# Patient Record
Sex: Male | Born: 1995 | Race: Black or African American | Hispanic: No | Marital: Single | Smoking: Never smoker
Health system: Southern US, Community
[De-identification: ages and names within clinical notes are randomized; demographics above are authoritative.]

---

## 2014-09-01 ENCOUNTER — Encounter (HOSPITAL_COMMUNITY): Payer: Self-pay | Admitting: Emergency Medicine

## 2014-09-01 ENCOUNTER — Emergency Department (HOSPITAL_COMMUNITY)
Admission: EM | Admit: 2014-09-01 | Discharge: 2014-09-01 | Disposition: A | Discharge status: Home / Self Care | Payer: BLUE CROSS/BLUE SHIELD | Attending: Emergency Medicine | Admitting: Emergency Medicine

## 2014-09-01 DIAGNOSIS — Y9389 Activity, other specified: Secondary | ICD-10-CM | POA: Diagnosis not present

## 2014-09-01 DIAGNOSIS — Y998 Other external cause status: Secondary | ICD-10-CM | POA: Diagnosis not present

## 2014-09-01 DIAGNOSIS — Z041 Encounter for examination and observation following transport accident: Secondary | ICD-10-CM | POA: Insufficient documentation

## 2014-09-01 DIAGNOSIS — Y92410 Unspecified street and highway as the place of occurrence of the external cause: Secondary | ICD-10-CM | POA: Insufficient documentation

## 2014-09-01 NOTE — ED Notes (Signed)
No answer
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
## 2014-09-01 NOTE — ED Notes (Signed)
Gave pt water, per Delorise Jackson - PA
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
## 2014-09-05 ENCOUNTER — Emergency Department (HOSPITAL_COMMUNITY)
Admission: EM | Admit: 2014-09-05 | Discharge: 2014-09-05 | Disposition: A | Discharge status: Home / Self Care | Payer: BLUE CROSS/BLUE SHIELD | Attending: Emergency Medicine | Admitting: Emergency Medicine

## 2014-09-05 ENCOUNTER — Encounter (HOSPITAL_COMMUNITY): Payer: Self-pay | Admitting: Nurse Practitioner

## 2014-09-05 DIAGNOSIS — M25511 Pain in right shoulder: Secondary | ICD-10-CM | POA: Insufficient documentation

## 2014-09-05 DIAGNOSIS — M25512 Pain in left shoulder: Secondary | ICD-10-CM | POA: Insufficient documentation

## 2014-09-05 DIAGNOSIS — M545 Low back pain, unspecified: Secondary | ICD-10-CM

## 2014-09-05 DIAGNOSIS — Z87828 Personal history of other (healed) physical injury and trauma: Secondary | ICD-10-CM | POA: Insufficient documentation

## 2014-09-05 DIAGNOSIS — M546 Pain in thoracic spine: Secondary | ICD-10-CM | POA: Diagnosis not present

## 2014-09-05 DIAGNOSIS — R51 Headache: Secondary | ICD-10-CM | POA: Insufficient documentation

## 2014-09-05 MED ORDER — CYCLOBENZAPRINE HCL 10 MG PO TABS: 10.0000 mg | ORAL_TABLET | Freq: Two times a day (BID) | ORAL | Status: DC | PRN

## 2014-09-05 MED ORDER — IBUPROFEN 800 MG PO TABS: 800.0000 mg | ORAL_TABLET | Freq: Three times a day (TID) | ORAL | Status: DC
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
## 2014-09-05 NOTE — Discharge Instructions (Signed)
Back Exercises Back exercises help treat and prevent back injuries. The goal of back exercises is to increase the strength of your abdominal and back muscles and the flexibility of your back. These exercises should be started when you no longer have back pain. Back exercises include:  Pelvic Tilt. Lie on your back with your knees bent. Tilt your pelvis until the lower part of your back is against the floor. Hold this position 5 to 10 sec and repeat 5 to 10 times.  Knee to Chest. Pull first 1 knee up against your chest and hold for 20 to 30 seconds, repeat this with the other knee, and then both knees. This may be done with the other leg straight or bent, whichever feels better.  Sit-Ups or Curl-Ups. Bend your knees 90 degrees. Start with tilting your pelvis, and do a partial, slow sit-up, lifting your trunk only 30 to 45 degrees off the floor. Take at least 2 to 3 seconds for each sit-up. Do not do sit-ups with your knees out straight. If partial sit-ups are difficult, simply do the above but with only tightening your abdominal muscles and holding it as directed.  Hip-Lift. Lie on your back with your knees flexed 90 degrees. Push down with your feet and shoulders as you raise your hips a couple inches off the floor; hold for 10 seconds, repeat 5 to 10 times.  Back arches. Lie on your stomach, propping yourself up on bent elbows. Slowly press on your hands, causing an arch in your low back. Repeat 3 to 5 times. Any initial stiffness and discomfort should lessen with repetition over time.  Shoulder-Lifts. Lie face down with arms beside your body. Keep hips and torso pressed to floor as you slowly lift your head and shoulders off the floor. Do not overdo your exercises, especially in the beginning. Exercises may cause you some mild back discomfort which lasts for a few minutes; however, if the pain is more severe, or lasts for more than 15 minutes, do not continue exercises until you see your caregiver.  Improvement with exercise therapy for back problems is slow.  See your caregivers for assistance with developing a proper back exercise program. Document Released: 04/10/2004 Document Revised: 05/26/2011 Document Reviewed: 01/02/2011 [facility] Patient Information 2015 [place], [place]. This information is not intended to replace advice given to you by your health care provider. Make sure you discuss any questions you have with your health care provider.  Back Injury Prevention Back injuries can be extremely painful and difficult to heal. After having one back injury, you are much more likely to experience another later on. It is important to learn how to avoid injuring or re-injuring your back. The following tips can help you to prevent a back injury. PHYSICAL FITNESS  Exercise regularly and try to develop good tone in your abdominal muscles. Your abdominal muscles provide a lot of the support needed by your back.  Do aerobic exercises (walking, jogging, biking, swimming) regularly.  Do exercises that increase balance and strength (tai chi, yoga) regularly. This can decrease your risk of falling and injuring your back.  Stretch before and after exercising.  Maintain a healthy weight. The more you weigh, the more stress is placed on your back. For every pound of weight, 10 times that amount of pressure is placed on the back. DIET  Talk to your caregiver about how much calcium and vitamin D you need per day. These nutrients help to prevent weakening of the bones (osteoporosis). Osteoporosis  can cause broken (fractured) bones that lead to back pain. °· Include good sources of calcium in your diet, such as dairy products, green, leafy vegetables, and products with calcium added (fortified). °· Include good sources of vitamin D in your diet, such as milk and foods that are fortified with vitamin D. °· Consider taking a nutritional supplement or a multivitamin if needed. °· Stop smoking if you  smoke. °POSTURE °· Sit and stand up straight. Avoid leaning forward when you sit or hunching over when you stand. °· Choose chairs with good low back (lumbar) support. °· If you work at a desk, sit close to your work so you do not need to lean over. Keep your chin tucked in. Keep your neck drawn back and elbows bent at a right angle. Your arms should look like the letter "L." °· Sit high and close to the steering wheel when you drive. Add a lumbar support to your car seat if needed. °· Avoid sitting or standing in one position for too long. Take breaks to get up, stretch, and walk around at least once every hour. Take breaks if you are driving for long periods of time. °· Sleep on your side with your knees slightly bent, or sleep on your back with a pillow under your knees. Do not sleep on your stomach. °LIFTING, TWISTING, AND REACHING °· Avoid heavy lifting, especially repetitive lifting. If you must do heavy lifting: °¨ Stretch before lifting. °¨ Work slowly. °¨ Rest between lifts. °¨ Use carts and dollies to move objects when possible. °¨ Make several small trips instead of carrying 1 heavy load. °¨ Ask for help when you need it. °¨ Ask for help when moving big, awkward objects. °· Follow these steps when lifting: °¨ Stand with your feet shoulder-width apart. °¨ Get as close to the object as you can. Do not try to pick up heavy objects that are far from your body. °¨ Use handles or lifting straps if they are available. °¨ Bend at your knees. Squat down, but keep your heels off the floor. °¨ Keep your shoulders pulled back, your chin tucked in, and your back straight. °¨ Lift the object slowly, tightening the muscles in your legs, abdomen, and buttocks. Keep the object as close to the center of your body as possible. °¨ When you put a load down, use these same guidelines in reverse. °· Do not: °¨ Lift the object above your waist. °¨ Twist at the waist while lifting or carrying a load. Move your feet if you need to  turn, not your waist. °¨ Bend over without bending at your knees. °· Avoid reaching over your head, across a table, or for an object on a high surface. °OTHER TIPS °· Avoid wet floors and keep sidewalks clear of ice to prevent falls. °· Do not sleep on a mattress that is too soft or too hard. °· Keep items that are used frequently within easy reach. °· Put heavier objects on shelves at waist level and lighter objects on lower or higher shelves. °· Find ways to decrease your stress, such as exercise, massage, or relaxation techniques. Stress can build up in your muscles. Tense muscles are more vulnerable to injury. °· Seek treatment for depression or anxiety if needed. These conditions can increase your risk of developing back pain. °SEEK MEDICAL CARE IF: °· You injure your back. °· You have questions about diet, exercise, or other ways to prevent back injuries. °MAKE SURE YOU: °· Understand these   instructions. °· Will watch your condition. °· Will get help right away if you are not doing well or get worse. °Document Released: 04/10/2004 Document Revised: 05/26/2011 Document Reviewed: 04/14/2011 °[employer]® Patient Information ©2015 [employer]. This information is not intended to replace advice given to you by your health care provider. Make sure you discuss any questions you have with your health care provider. ° °
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
## 2015-02-15 ENCOUNTER — Encounter (HOSPITAL_COMMUNITY): Payer: Self-pay | Admitting: Neurology

## 2015-02-15 ENCOUNTER — Emergency Department (HOSPITAL_COMMUNITY)
Admission: EM | Admit: 2015-02-15 | Discharge: 2015-02-15 | Disposition: A | Discharge status: Home / Self Care | Payer: BLUE CROSS/BLUE SHIELD | Attending: Emergency Medicine | Admitting: Emergency Medicine

## 2015-02-15 ENCOUNTER — Emergency Department (HOSPITAL_COMMUNITY): Payer: BLUE CROSS/BLUE SHIELD

## 2015-02-15 DIAGNOSIS — Y998 Other external cause status: Secondary | ICD-10-CM | POA: Diagnosis not present

## 2015-02-15 DIAGNOSIS — S199XXA Unspecified injury of neck, initial encounter: Secondary | ICD-10-CM | POA: Insufficient documentation

## 2015-02-15 DIAGNOSIS — M7918 Myalgia, other site: Secondary | ICD-10-CM

## 2015-02-15 DIAGNOSIS — Y92410 Unspecified street and highway as the place of occurrence of the external cause: Secondary | ICD-10-CM | POA: Diagnosis not present

## 2015-02-15 DIAGNOSIS — Y9389 Activity, other specified: Secondary | ICD-10-CM | POA: Diagnosis not present

## 2015-02-15 DIAGNOSIS — S3992XA Unspecified injury of lower back, initial encounter: Secondary | ICD-10-CM | POA: Insufficient documentation

## 2015-02-15 MED ORDER — CYCLOBENZAPRINE HCL 10 MG PO TABS: 10.0000 mg | ORAL_TABLET | Freq: Three times a day (TID) | ORAL | Status: DC | PRN

## 2015-02-15 MED ORDER — IBUPROFEN 800 MG PO TABS: 800.0000 mg | ORAL_TABLET | Freq: Three times a day (TID) | ORAL | Status: DC | PRN
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
## 2015-02-15 NOTE — ED Notes (Signed)
Pt verbalized understanding of d/c instructions and has no further questions. Pt stable and NAD.  
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
## 2015-02-17 ENCOUNTER — Encounter (HOSPITAL_COMMUNITY): Payer: Self-pay | Admitting: Nurse Practitioner

## 2015-02-17 ENCOUNTER — Emergency Department (HOSPITAL_COMMUNITY)
Admission: EM | Admit: 2015-02-17 | Discharge: 2015-02-17 | Disposition: A | Discharge status: Home / Self Care | Payer: BLUE CROSS/BLUE SHIELD | Attending: Emergency Medicine | Admitting: Emergency Medicine

## 2015-02-17 DIAGNOSIS — M5412 Radiculopathy, cervical region: Secondary | ICD-10-CM

## 2015-02-17 DIAGNOSIS — M546 Pain in thoracic spine: Secondary | ICD-10-CM | POA: Insufficient documentation

## 2015-02-17 DIAGNOSIS — R51 Headache: Secondary | ICD-10-CM | POA: Insufficient documentation

## 2015-02-17 DIAGNOSIS — M2540 Effusion, unspecified joint: Secondary | ICD-10-CM | POA: Diagnosis not present

## 2015-02-17 DIAGNOSIS — G8929 Other chronic pain: Secondary | ICD-10-CM | POA: Diagnosis not present

## 2015-02-17 DIAGNOSIS — G8911 Acute pain due to trauma: Secondary | ICD-10-CM | POA: Insufficient documentation

## 2015-02-17 DIAGNOSIS — M545 Low back pain: Secondary | ICD-10-CM | POA: Diagnosis not present

## 2015-02-17 DIAGNOSIS — M549 Dorsalgia, unspecified: Secondary | ICD-10-CM

## 2015-02-17 DIAGNOSIS — M542 Cervicalgia: Secondary | ICD-10-CM

## 2015-02-17 MED ORDER — DEXAMETHASONE SODIUM PHOSPHATE 10 MG/ML IJ SOLN
6.0000 mg | Freq: Once | INTRAMUSCULAR | Status: AC
  Administered 2015-02-17: 6 mg via INTRAMUSCULAR
  Filled 2015-02-17: qty 1

## 2015-02-17 MED ORDER — IBUPROFEN 800 MG PO TABS: 800.0000 mg | ORAL_TABLET | Freq: Three times a day (TID) | ORAL | Status: AC | PRN

## 2015-02-17 MED ORDER — CYCLOBENZAPRINE HCL 5 MG PO TABS: 5.0000 mg | ORAL_TABLET | Freq: Three times a day (TID) | ORAL | Status: AC | PRN

## 2015-02-17 MED ORDER — KETOROLAC TROMETHAMINE 60 MG/2ML IM SOLN
60.0000 mg | Freq: Once | INTRAMUSCULAR | Status: AC
  Administered 2015-02-17: 60 mg via INTRAMUSCULAR
  Filled 2015-02-17: qty 2
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
## 2015-02-17 NOTE — Discharge Instructions (Signed)
Cervical Radiculopathy Cervical radiculopathy happens when a nerve in the neck (cervical nerve) is pinched or bruised. This condition can develop because of an injury or as part of the normal aging process. Pressure on the cervical nerves can cause pain or numbness that runs from the neck all the way down into the arm and fingers. Usually, this condition gets better with rest. Treatment may be needed if the condition does not improve.  CAUSES This condition may be caused by:  Injury.  Slipped (herniated) disk.  Muscle tightness in the neck because of overuse.  Arthritis.  Breakdown or degeneration in the bones and joints of the spine (spondylosis) due to aging.  Bone spurs that may develop near the cervical nerves. SYMPTOMS Symptoms of this condition include:  Pain that runs from the neck to the arm and hand. The pain can be severe or irritating. It may be worse when the neck is moved.  Numbness or weakness in the affected arm and hand. Back Pain, Adult Back pain is very common in adults.The cause of back pain is rarely dangerous and the pain often gets better over time.The cause of your back pain may not be known. Some common causes of back pain include: Strain of the muscles or ligaments supporting the spine. Wear and tear (degeneration) of the spinal disks. Arthritis. Direct injury to the back. For many people, back pain may return. Since back pain is rarely dangerous, most people can learn to manage this condition on their own. HOME CARE INSTRUCTIONS Watch your back pain for any changes. The following actions may help to lessen any discomfort you are feeling: Remain active. It is stressful on your back to sit or stand in one place for long periods of time. Do not sit, drive, or stand in one place for more than 30 minutes at a time. Take short walks on even surfaces as soon as you are able.Try to increase the length of time you walk each day. Exercise regularly as directed by  your health care provider. Exercise helps your back heal faster. It also helps avoid future injury by keeping your muscles strong and flexible. Do not stay in bed.Resting more than 1-2 days can delay your recovery. Pay attention to your body when you bend and lift. The most comfortable positions are those that put less stress on your recovering back. Always use proper lifting techniques, including: Bending your knees. Keeping the load close to your body. Avoiding twisting. Find a comfortable position to sleep. Use a firm mattress and lie on your side with your knees slightly bent. If you lie on your back, put a pillow under your knees. Avoid feeling anxious or stressed.Stress increases muscle tension and can worsen back pain.It is important to recognize when you are anxious or stressed and learn ways to manage it, such as with exercise. Take medicines only as directed by your health care provider. Over-the-counter medicines to reduce pain and inflammation are often the most helpful.Your health care provider may prescribe muscle relaxant drugs.These medicines help dull your pain so you can more quickly return to your normal activities and healthy exercise. Apply ice to the injured area: Put ice in a plastic bag. Place a towel between your skin and the bag. Leave the ice on for 20 minutes, 2-3 times a day for the first 2-3 days. After that, ice and heat may be alternated to reduce pain and spasms. Maintain a healthy weight. Excess weight puts extra stress on your back and makes it  difficult to maintain good posture. SEEK MEDICAL CARE IF: You have pain that is not relieved with rest or medicine. You have increasing pain going down into the legs or buttocks. You have pain that does not improve in one week. You have night pain. You lose weight. You have a fever or chills. SEEK IMMEDIATE MEDICAL CARE IF:  You develop new bowel or bladder control problems. You have unusual weakness or numbness  in your arms or legs. You develop nausea or vomiting. You develop abdominal pain. You feel faint.   This information is not intended to replace advice given to you by your health care provider. Make sure you discuss any questions you have with your health care provider.   Document Released: 03/03/2005 Document Revised: 03/24/2014 Document Reviewed: 07/05/2013 Elsevier Interactive Patient Education 2016 [employer].  DIAGNOSIS This condition may be diagnosed based on symptoms, medical history, and a physical exam. You may also have tests, including:  X-rays.  CT scan.  MRI.  Electromyogram (EMG).  Nerve conduction tests. TREATMENT In many cases, treatment is not needed for this condition. With rest, the condition usually gets better over time. If treatment is needed, options may include:  Wearing a soft neck collar for short periods of time.  Physical therapy to strengthen your neck muscles.  Medicines, such as NSAIDs, oral corticosteroids, or spinal injections.  Surgery. This may be needed if other treatments do not help. Various types of surgery may be done depending on the cause of your problems. HOME CARE INSTRUCTIONS Managing Pain  Take over-the-counter and prescription medicines only as told by your health care provider.  If directed, apply ice to the affected area.  Put ice in a plastic bag.  Place a towel between your skin and the bag.  Leave the ice on for 20 minutes, 2-3 times per day.  If ice does not help, you can try using heat. Take a warm shower or warm bath, or use a heat pack as told by your health care provider.  Try a gentle neck and shoulder massage to help relieve symptoms. Activity  Rest as needed. Follow instructions from your health care provider about any restrictions on activities.  Do stretching and strengthening exercises as told by your health care provider or physical therapist. General Instructions  If you were given a soft  collar, wear it as told by your health care provider.  Use a flat pillow when you sleep.  Keep all follow-up visits as told by your health care provider. This is important. SEEK MEDICAL CARE IF:  Your condition does not improve with treatment. SEEK IMMEDIATE MEDICAL CARE IF:  Your pain gets much worse and cannot be controlled with medicines.  You have weakness or numbness in your hand, arm, face, or leg.  You have a high fever.  You have a stiff, rigid neck.  You lose control of your bowels or your bladder (have incontinence).  You have trouble with walking, balance, or speaking.   This information is not intended to replace advice given to you by your health care provider. Make sure you discuss any questions you have with your health care provider.   Document Released: 11/26/2000 Document Revised: 11/22/2014 Document Reviewed: 04/27/2014 Elsevier Interactive Patient Education [employer]

## 2015-02-19 ENCOUNTER — Other Ambulatory Visit (HOSPITAL_COMMUNITY): Payer: Self-pay | Admitting: Chiropractic Medicine

## 2015-02-19 ENCOUNTER — Ambulatory Visit (HOSPITAL_COMMUNITY)
Admission: RE | Admit: 2015-02-19 | Discharge: 2015-02-19 | Disposition: A | Discharge status: Home / Self Care | Payer: BLUE CROSS/BLUE SHIELD | Source: Ambulatory Visit | Attending: Chiropractic Medicine | Admitting: Chiropractic Medicine

## 2015-02-19 DIAGNOSIS — M542 Cervicalgia: Secondary | ICD-10-CM

## 2015-02-19 DIAGNOSIS — M549 Dorsalgia, unspecified: Secondary | ICD-10-CM | POA: Insufficient documentation

## 2015-02-19 DIAGNOSIS — G8929 Other chronic pain: Secondary | ICD-10-CM

## 2016-03-19 ENCOUNTER — Encounter (HOSPITAL_COMMUNITY): Payer: Self-pay | Admitting: Emergency Medicine

## 2016-03-19 ENCOUNTER — Emergency Department (HOSPITAL_COMMUNITY)
Admission: EM | Admit: 2016-03-19 | Discharge: 2016-03-19 | Disposition: A | Discharge status: Home / Self Care | Payer: BLUE CROSS/BLUE SHIELD | Attending: Emergency Medicine | Admitting: Emergency Medicine

## 2016-03-19 DIAGNOSIS — R112 Nausea with vomiting, unspecified: Secondary | ICD-10-CM | POA: Diagnosis not present

## 2016-03-19 DIAGNOSIS — R197 Diarrhea, unspecified: Secondary | ICD-10-CM | POA: Diagnosis not present

## 2016-03-19 MED ORDER — ONDANSETRON 4 MG PO TBDP
8.0000 mg | ORAL_TABLET | Freq: Once | ORAL | Status: AC
Start: 2016-03-19 — End: 2016-03-19
  Administered 2016-03-19: 8 mg via ORAL
  Filled 2016-03-19: qty 2

## 2016-03-19 MED ORDER — ONDANSETRON 8 MG PO TBDP: 8.0000 mg | ORAL_TABLET | Freq: Three times a day (TID) | ORAL | 0 refills | Status: AC | PRN

## 2016-03-19 NOTE — ED Notes (Signed)
Pt denies abdominal cramping or nausea at this time. Pt is well appearing. States, hes been drinking ginger ale and its helped his stomach.

## 2016-03-19 NOTE — ED Provider Notes (Signed)
[facility]-EMERGENCY DEPT Provider Note   CSN: [id number] Arrival date & time: 03/19/16  1318  By signing my name below, I, Arianna Nassar, attest that this documentation has been prepared under the direction and in the presence of Margarita Grizzle, MD.  Electronically Signed: Octavia Heir, ED Scribe. 03/19/16. 2:59 PM.    History   Chief Complaint Chief Complaint  Patient presents with  . Emesis  . Diarrhea    The history is provided by the patient. No language interpreter was used.   HPI Comments: Raymond Roman is a 21 y.o. male who presents to the Emergency Department complaining of moderate, gradual improving, moderate, emesis x 3 days. He notes associated nausea, diarrhea (x 6-7 today), and associated epigastric abdominal cramping. Pt reports vomiting once today and none yesterday. He says he does not remember the last time he voided normally. Pt reports that his symptoms started after eating seafood 3 days ago. He has been having difficulty keeping down fluids or food secondary to vomiting. Pt has tried consuming ginger ale and gatorade without relief. He denies fever, chills, or blood in stool. Pt is former smoker and stopped smoking one week ago.  History reviewed. No pertinent past medical history.  There are no active problems to display for this patient.   History reviewed. No pertinent surgical history.     Home Medications    Prior to Admission medications   Medication Sig Start Date End Date Taking? Authorizing Provider  cyclobenzaprine (FLEXERIL) 5 MG tablet Take 1 tablet (5 mg total) by mouth 3 (three) times daily as needed for muscle spasms (and pain). 02/17/15   Danelle Berry, PA-C  ibuprofen (ADVIL,MOTRIN) 800 MG tablet Take 1 tablet (800 mg total) by mouth every 8 (eight) hours as needed for headache, mild pain or moderate pain. 02/17/15   Danelle Berry, PA-C    Family History No family history on file.  Social History Social History  Substance Use Topics  .  Smoking status: Never Smoker  . Smokeless tobacco: Not on file  . Alcohol use Yes     Allergies   Patient has no known allergies.   Review of Systems Review of Systems  Constitutional: Negative for chills and fever.  Gastrointestinal: Positive for abdominal pain (cramping), diarrhea, nausea and vomiting.  All other systems reviewed and are negative.    Physical Exam Updated Vital Signs BP 137/86   Pulse 99   Temp 98.5 F (36.9 C) (Oral)   Resp 16   SpO2 99%   Physical Exam  Constitutional: He is oriented to person, place, and time. He appears well-developed and well-nourished.  HENT:  Head: Normocephalic and atraumatic.  Right Ear: External ear normal.  Left Ear: External ear normal.  Nose: Nose normal.  Mouth/Throat: Oropharynx is clear and moist.  Eyes: Conjunctivae and EOM are normal. Pupils are equal, round, and reactive to light.  Neck: Normal range of motion. Neck supple.  Cardiovascular: Normal rate, regular rhythm, normal heart sounds and intact distal pulses.   Pulmonary/Chest: Effort normal and breath sounds normal. No respiratory distress. He has no wheezes. He exhibits no tenderness.  Abdominal: Soft. Bowel sounds are normal. He exhibits no distension and no mass. There is no tenderness. There is no guarding.  Musculoskeletal: Normal range of motion.  Neurological: He is alert and oriented to person, place, and time. He has normal reflexes. He exhibits normal muscle tone. Coordination normal.  Skin: Skin is warm and dry.  Psychiatric: He has a normal mood  and affect. His behavior is normal. Judgment and thought content normal.  Nursing note and vitals reviewed.    ED Treatments / Results  DIAGNOSTIC STUDIES: Oxygen Saturation is 99% on RA, normal by my interpretation.  COORDINATION OF CARE:  2:20 PM Discussed treatment plan which includes zofran with pt at bedside and pt agreed to plan.  3:30 PM Pt is able to tolerate fluids appropriately. He will  receive a work note and return to work on 01/05. Pt was advised to stay hydrated with clear fluids.   Labs (all labs ordered are listed, but only abnormal results are displayed) Labs Reviewed - No data to display  EKG  EKG Interpretation None       Radiology No results found.  Procedures Procedures (including critical care time)  Medications Ordered in ED Medications - No data to display   Initial Impression / Assessment and Plan / ED Course  I have reviewed the triage vital signs and the nursing notes.  Pertinent labs & imaging results that were available during my care of the patient were reviewed by me and considered in my medical decision making (see chart for details).  Clinical Course     Patient given zofran and tolerating po fluids here.  Discussed oral rehydration.  Discussed return precautions and need for follow up.   Final Clinical Impressions(s) / ED Diagnoses   Final diagnoses:  Nausea vomiting and diarrhea   I personally performed the services described in this documentation, which was scribed in my presence. The recorded information has been reviewed and considered.    New Prescriptions Discharge Medication List as of 03/19/2016  3:34 PM    START taking these medications   Details  ondansetron (ZOFRAN ODT) 8 MG disintegrating tablet Take 1 tablet (8 mg total) by mouth every 8 (eight) hours as needed for nausea or vomiting., Starting Wed 03/19/2016, Print         Margarita Grizzleanielle Mekaylah Klich, MD 03/19/16 1615

## 2016-03-19 NOTE — ED Triage Notes (Signed)
Pt c/o n/v/d since Monday, pt in nad, c/o stomach cramping. States "idk if I have a stomach bug or not but I cant keep anything down".
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
